# Patient Record
Sex: Female | Born: 1935 | Race: White | Hispanic: No | Smoking: Never smoker
Health system: Southern US, Community
[De-identification: ages and names within clinical notes are randomized; demographics above are authoritative.]

## PROBLEM LIST (undated history)

## (undated) DIAGNOSIS — R238 Other skin changes: Secondary | ICD-10-CM

## (undated) DIAGNOSIS — R413 Other amnesia: Secondary | ICD-10-CM

## (undated) DIAGNOSIS — I10 Essential (primary) hypertension: Secondary | ICD-10-CM

## (undated) DIAGNOSIS — E119 Type 2 diabetes mellitus without complications: Secondary | ICD-10-CM

## (undated) DIAGNOSIS — R233 Spontaneous ecchymoses: Secondary | ICD-10-CM

## (undated) DIAGNOSIS — K219 Gastro-esophageal reflux disease without esophagitis: Secondary | ICD-10-CM

## (undated) DIAGNOSIS — E079 Disorder of thyroid, unspecified: Secondary | ICD-10-CM

## (undated) DIAGNOSIS — D689 Coagulation defect, unspecified: Secondary | ICD-10-CM

## (undated) DIAGNOSIS — M1990 Unspecified osteoarthritis, unspecified site: Secondary | ICD-10-CM

## (undated) HISTORY — DX: Other amnesia: R41.3

## (undated) HISTORY — DX: Spontaneous ecchymoses: R23.3

## (undated) HISTORY — DX: Unspecified osteoarthritis, unspecified site: M19.90

## (undated) HISTORY — DX: Gastro-esophageal reflux disease without esophagitis: K21.9

## (undated) HISTORY — PX: TOTAL HIP ARTHROPLASTY: SHX124

## (undated) HISTORY — DX: Type 2 diabetes mellitus without complications: E11.9

## (undated) HISTORY — DX: Other skin changes: R23.8

## (undated) HISTORY — DX: Coagulation defect, unspecified: D68.9

## (undated) HISTORY — DX: Essential (primary) hypertension: I10

## (undated) HISTORY — DX: Disorder of thyroid, unspecified: E07.9

## (undated) HISTORY — PX: SKIN CANCER EXCISION: SHX779

---

## 1998-06-16 ENCOUNTER — Other Ambulatory Visit: Admission: RE | Admit: 1998-06-16 | Discharge: 1998-06-16 | Payer: Self-pay | Admitting: Obstetrics and Gynecology

## 1999-06-18 ENCOUNTER — Other Ambulatory Visit: Admission: RE | Admit: 1999-06-18 | Discharge: 1999-06-18 | Payer: Self-pay | Admitting: Obstetrics and Gynecology

## 2000-03-06 ENCOUNTER — Encounter: Payer: Self-pay | Admitting: Emergency Medicine

## 2000-03-06 ENCOUNTER — Emergency Department (HOSPITAL_COMMUNITY): Admission: EM | Admit: 2000-03-06 | Discharge: 2000-03-07 | Payer: Self-pay | Admitting: Emergency Medicine

## 2000-07-07 ENCOUNTER — Other Ambulatory Visit: Admission: RE | Admit: 2000-07-07 | Discharge: 2000-07-07 | Payer: Self-pay | Admitting: Obstetrics and Gynecology

## 2001-07-19 ENCOUNTER — Other Ambulatory Visit: Admission: RE | Admit: 2001-07-19 | Discharge: 2001-07-19 | Payer: Self-pay | Admitting: Obstetrics and Gynecology

## 2005-08-26 ENCOUNTER — Inpatient Hospital Stay (HOSPITAL_COMMUNITY): Admission: EM | Admit: 2005-08-26 | Discharge: 2005-08-29 | Payer: Self-pay | Admitting: Emergency Medicine

## 2007-02-17 IMAGING — CT CT HEAD W/O CM
1 series · 16 of 30 positions shown, 20 images · non-contrast
Comparison: None

CLINICAL DATA: Weakness

HEAD CT WITHOUT CONTRAST
TECHNIQUE: 5mm collimated images were obtained from the base of the skull
through the vertex according to standard protocol without contrast.

[Series 2: head_seq 4.5 h45s st · axial · 0.43mm/px · z∈[+1033,+1159]mm · 16 of 32 slices shown, 20 images]
[im 2/32  brain]
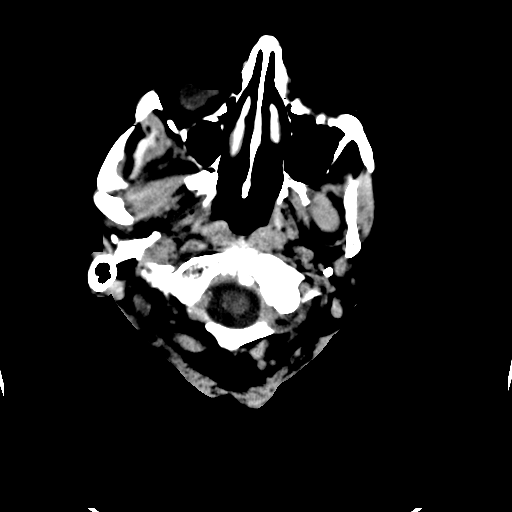
[im 2/32  bone]
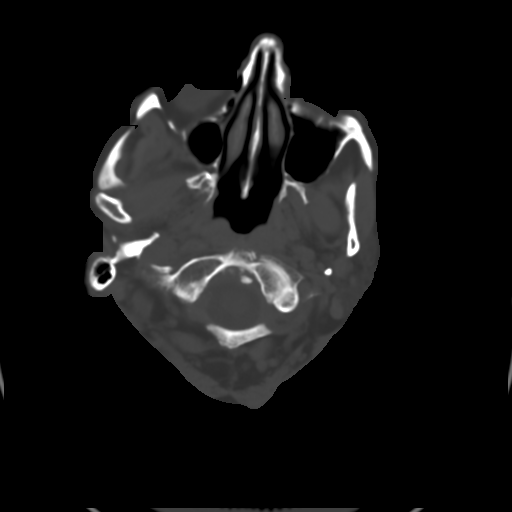
[im 4/32  brain]
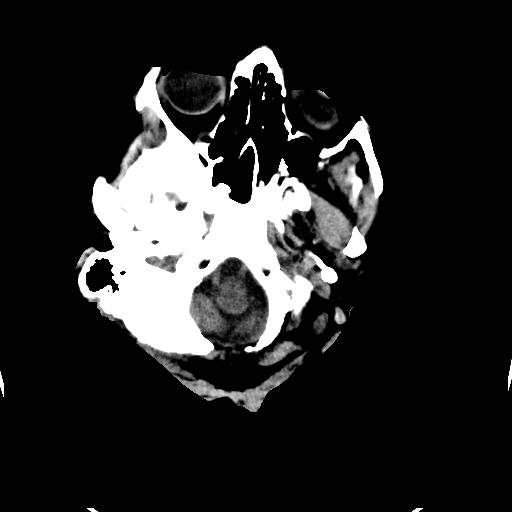
[im 6/32  brain]
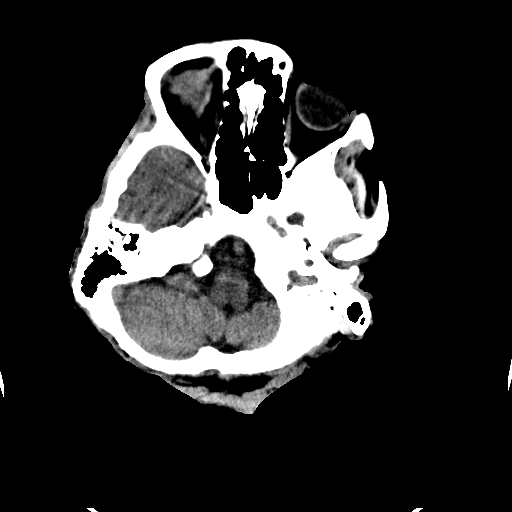
[im 8/32  brain]
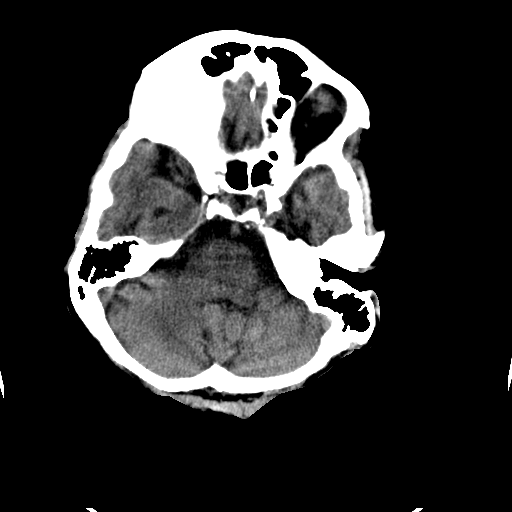
[im 9/32  brain]
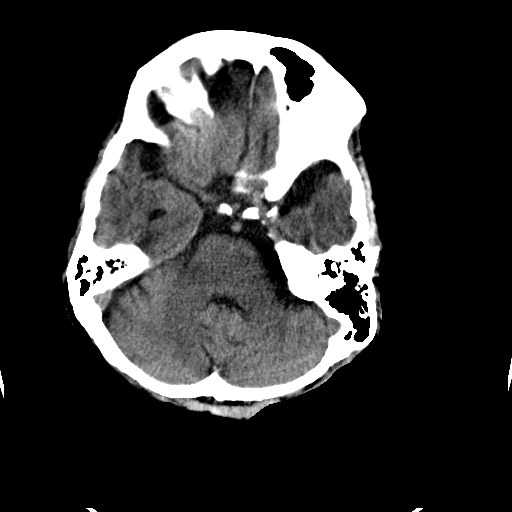
[im 9/32  bone]
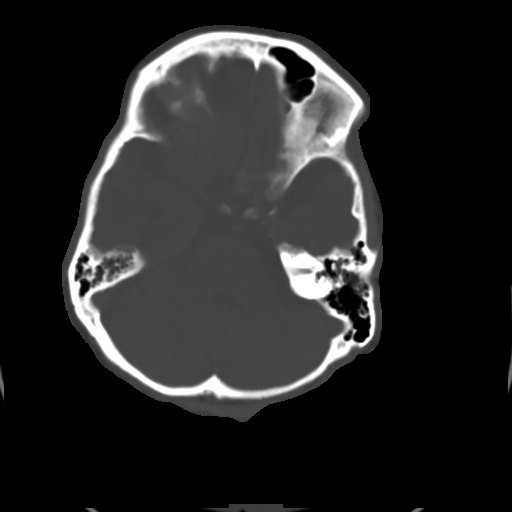
[im 11/32  brain]
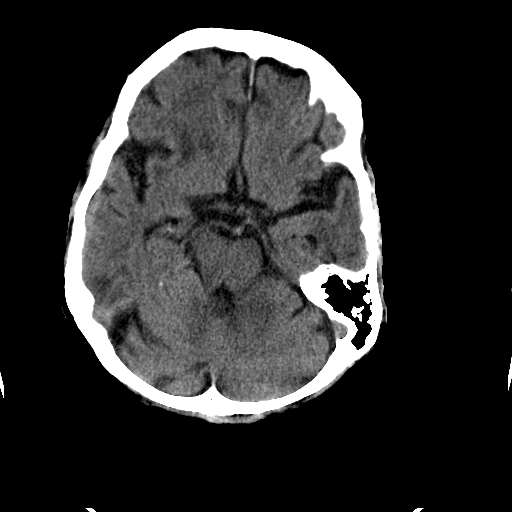
[im 13/32  brain]
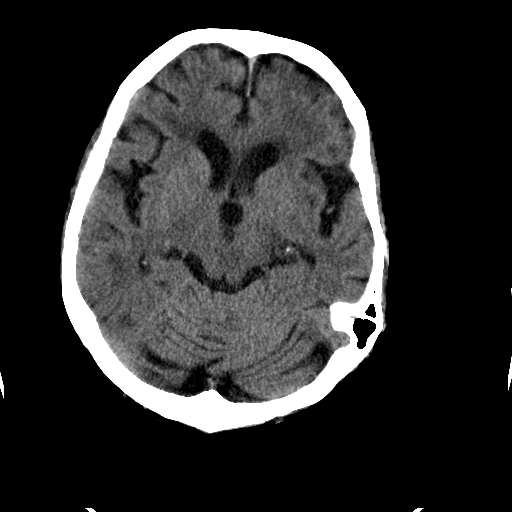
[im 15/32  brain]
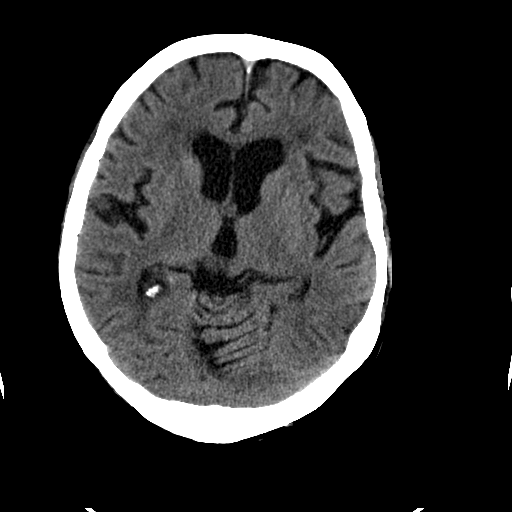
[im 17/32  brain]
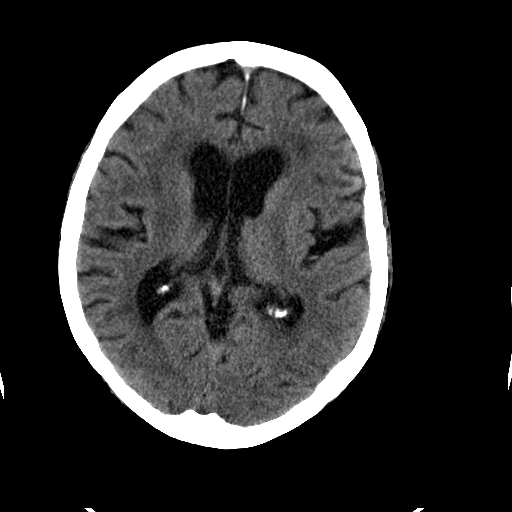
[im 17/32  bone]
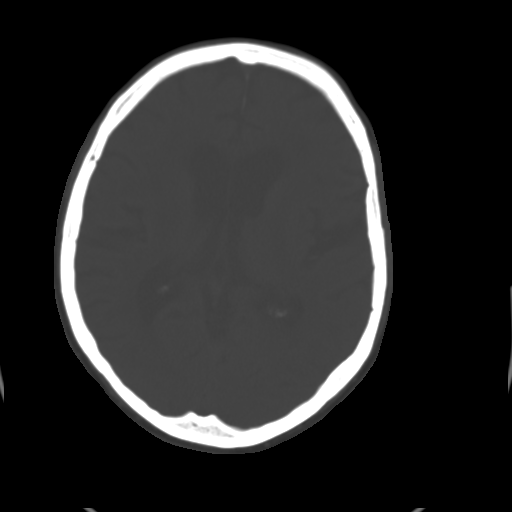
[im 19/32  brain]
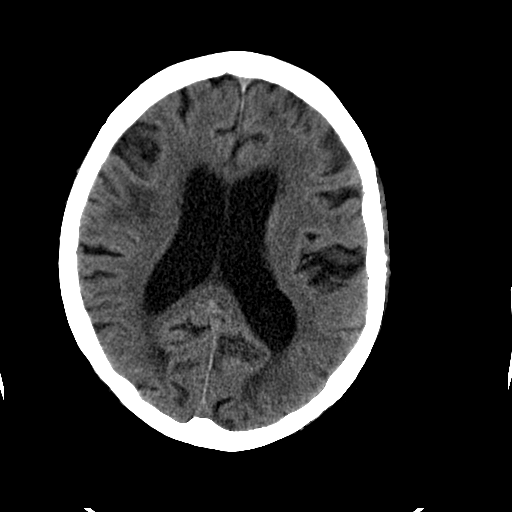
[im 21/32  brain]
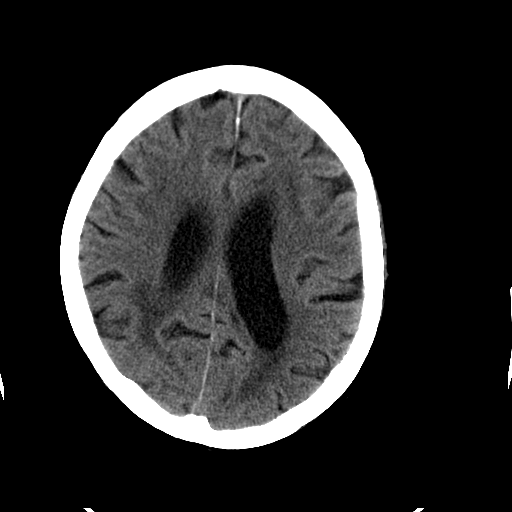
[im 23/32  brain]
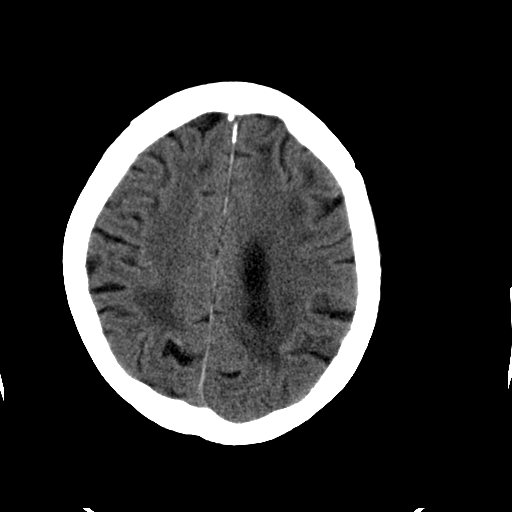
[im 24/32  brain]
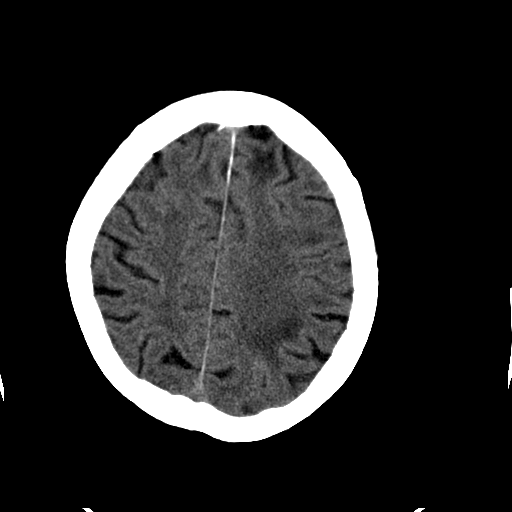
[im 24/32  bone]
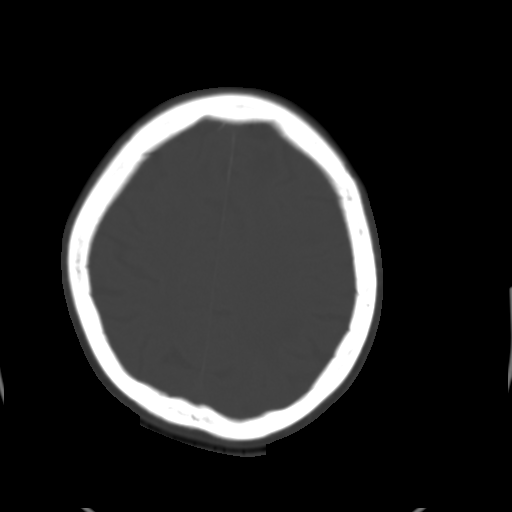
[im 26/32  brain]
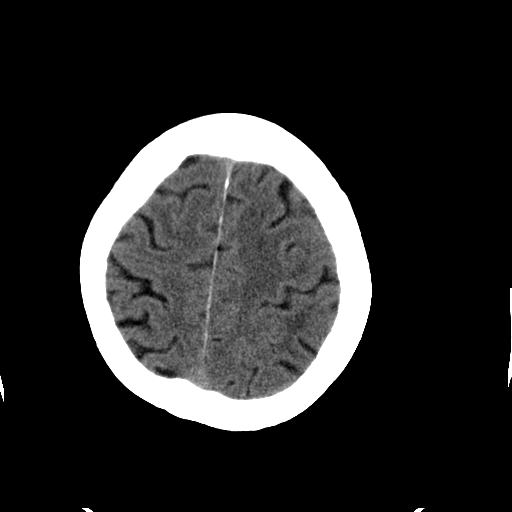
[im 28/32  brain]
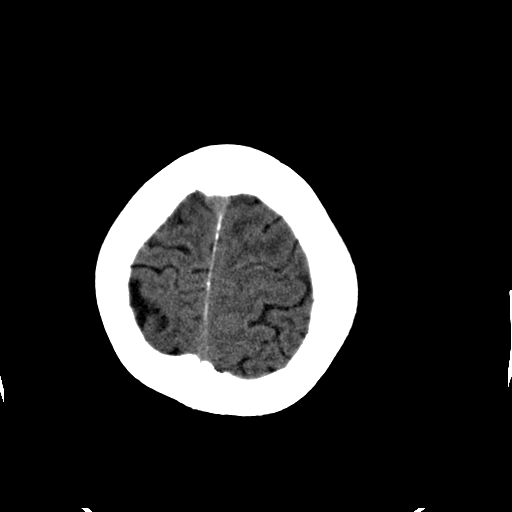
[im 30/32  brain]
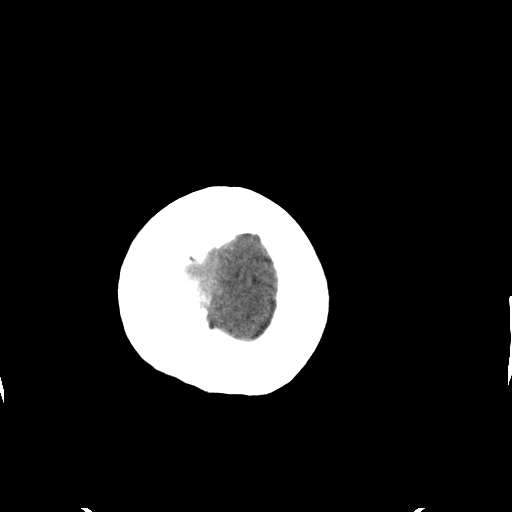

[16 of 30 positions shown; findings below may reference images not displayed]

FINDINGS: There is chronic small vessel changes in the deep white matter. No
evidence of hemorrhage, hydrocephalus, tumor, vascular lesion, or acute
infarction. Visualized paranasal sinuses and calvarium unremarkable.

IMPRESSION

Chronic small vessel changes. No acute findings.

## 2011-05-12 ENCOUNTER — Encounter: Payer: Self-pay | Admitting: Podiatry

## 2011-05-12 DIAGNOSIS — M1990 Unspecified osteoarthritis, unspecified site: Secondary | ICD-10-CM | POA: Insufficient documentation

## 2011-05-12 DIAGNOSIS — I10 Essential (primary) hypertension: Secondary | ICD-10-CM

## 2011-05-12 DIAGNOSIS — B351 Tinea unguium: Secondary | ICD-10-CM | POA: Insufficient documentation

## 2013-12-12 ENCOUNTER — Ambulatory Visit (INDEPENDENT_AMBULATORY_CARE_PROVIDER_SITE_OTHER): Payer: Medicare Other

## 2013-12-12 VITALS — BP 135/61 | HR 62 | Resp 16

## 2013-12-12 DIAGNOSIS — E1140 Type 2 diabetes mellitus with diabetic neuropathy, unspecified: Secondary | ICD-10-CM

## 2013-12-12 DIAGNOSIS — B351 Tinea unguium: Secondary | ICD-10-CM

## 2013-12-12 DIAGNOSIS — L608 Other nail disorders: Secondary | ICD-10-CM

## 2013-12-12 DIAGNOSIS — M1990 Unspecified osteoarthritis, unspecified site: Secondary | ICD-10-CM

## 2013-12-12 DIAGNOSIS — E1149 Type 2 diabetes mellitus with other diabetic neurological complication: Secondary | ICD-10-CM

## 2013-12-12 DIAGNOSIS — Q828 Other specified congenital malformations of skin: Secondary | ICD-10-CM

## 2013-12-12 NOTE — Patient Instructions (Signed)
Diabetes and Foot Care Diabetes may cause you to have problems because of poor blood supply (circulation) to your feet and legs. This may cause the skin on your feet to become thinner, break easier, and heal more slowly. Your skin may become dry, and the skin may peel and crack. You may also have nerve damage in your legs and feet causing decreased feeling in them. You may not notice minor injuries to your feet that could lead to infections or more serious problems. Taking care of your feet is one of the most important things you can do for yourself.  HOME CARE INSTRUCTIONS  Wear shoes at all times, even in the house. Do not go barefoot. Bare feet are easily injured.  Check your feet daily for blisters, cuts, and redness. If you cannot see the bottom of your feet, use a mirror or ask someone for help.  Wash your feet with warm water (do not use hot water) and mild soap. Then pat your feet and the areas between your toes until they are completely dry. Do not soak your feet as this can dry your skin.  Apply a moisturizing lotion or petroleum jelly (that does not contain alcohol and is unscented) to the skin on your feet and to dry, brittle toenails. Do not apply lotion between your toes.  Trim your toenails straight across. Do not dig under them or around the cuticle. File the edges of your nails with an emery board or nail file.  Do not cut corns or calluses or try to remove them with medicine.  Wear clean socks or stockings every day. Make sure they are not too tight. Do not wear knee-high stockings since they may decrease blood flow to your legs.  Wear shoes that fit properly and have enough cushioning. To break in new shoes, wear them for just a few hours a day. This prevents you from injuring your feet. Always look in your shoes before you put them on to be sure there are no objects inside.  Do not cross your legs. This may decrease the blood flow to your feet.  If you find a minor scrape,  cut, or break in the skin on your feet, keep it and the skin around it clean and dry. These areas may be cleansed with mild soap and water. Do not cleanse the area with peroxide, alcohol, or iodine.  When you remove an adhesive bandage, be sure not to damage the skin around it.  If you have a wound, look at it several times a day to make sure it is healing.  Do not use heating pads or hot water bottles. They may burn your skin. If you have lost feeling in your feet or legs, you may not know it is happening until it is too late.  Make sure your health care provider performs a complete foot exam at least annually or more often if you have foot problems. Report any cuts, sores, or bruises to your health care provider immediately. SEEK MEDICAL CARE IF:   You have an injury that is not healing.  You have cuts or breaks in the skin.  You have an ingrown nail.  You notice redness on your legs or feet.  You feel burning or tingling in your legs or feet.  You have pain or cramps in your legs and feet.  Your legs or feet are numb.  Your feet always feel cold. SEEK IMMEDIATE MEDICAL CARE IF:   There is increasing redness,   swelling, or pain in or around a wound.  There is a red line that goes up your leg.  Pus is coming from a wound.  You develop a fever or as directed by your health care provider.  You notice a bad smell coming from an ulcer or wound. Document Released: 09/16/2000 Document Revised: 05/22/2013 Document Reviewed: 02/26/2013 [employer] Patient Information 2014 [employer]  

## 2013-12-12 NOTE — Progress Notes (Signed)
   Subjective:    Patient ID: Kristen Peck, female    DOB: [birth year], 80 y.o.   MRN: [id number]  HPI Daughter in law states that the nails need to be trimmed and the calluses need to be trimmed also    Review of Systems  Cardiovascular: Positive for leg swelling.  Musculoskeletal: Positive for gait problem.       Joint pain  Skin:       Open sores   Hematological: Bruises/bleeds easily.       Slow to heal  Psychiatric/Behavioral: Positive for confusion.  All other systems reviewed and are negative.       Objective:   Physical Exam Vascular status is intact although diminished pedal pulses palpable with thready DP and PT plus one over 4 bilateral capillary refill time 4 seconds all digits skin temperature warm turgor normal decreased epicritic and proprioceptive sensations on Semmes Weinstein testing to the digits and forefoot bilateral. Patient does have thick brittle crumbly friable gratified nails 1 through 5 bilateral painful tender symptomatic with glucose discoloration and yellowing and brittleness. There is multiple areas of hyperkeratoses medial plantar left heel has diffuse keratoses with friability. There is also punctate keratoses lateral fifth MTP area right foot and multiple keratoses of the lateral fifth MTP area right sub-mid arch or early left hip MTP area were left sub-mid arch left and distal clavus second left. Multiple nucleated keratotic lesions are debrided and so areas develop some pinpoint bleeding the dura dressed with lumicain Neosporin and Band-Aid dressings. The nails are debrided by x10 at this time as well. Patient ambulating accommodate shoes and cotton or Kerlix socks       Assessment & Plan:  Assessment this time his diabetes with complications been more than a year since patient was last seen has been going to a nail salon has not had any debridement of keratoses and likely over a year. Painful neuropathic friable gratified thick nails and multiple  keratotic lesions again digital contractures and a hammertoe deformities and arthropathy are noted at this time nails debrided x10 multiple keratoses lesions are debrided these 3 lesions left one reason right foot followup in 3 months for continued palliative care suggested meetings Neosporin Band-Aid for several areas following debridement today there treated with lumicain.  Alvan Dameichard Ollis Daudelin DPM

## 2014-03-13 ENCOUNTER — Ambulatory Visit: Payer: Medicare Other

## 2016-01-02 DEATH — deceased
